# Patient Record
Sex: Male | Born: 1990 | Hispanic: Yes | Marital: Married | Smoking: Current some day smoker
Health system: Southern US, Community
[De-identification: ages and names within clinical notes are randomized; demographics above are authoritative.]

---

## 2015-01-24 ENCOUNTER — Ambulatory Visit: Payer: Worker's Compensation

## 2015-01-24 ENCOUNTER — Ambulatory Visit (INDEPENDENT_AMBULATORY_CARE_PROVIDER_SITE_OTHER): Payer: Worker's Compensation | Admitting: Urgent Care

## 2015-01-24 VITALS — BP 116/80 | HR 67 | Temp 98.3°F | Resp 20 | Ht 67.5 in | Wt 199.4 lb

## 2015-01-24 DIAGNOSIS — S6992XA Unspecified injury of left wrist, hand and finger(s), initial encounter: Secondary | ICD-10-CM

## 2015-01-24 DIAGNOSIS — S6990XA Unspecified injury of unspecified wrist, hand and finger(s), initial encounter: Secondary | ICD-10-CM | POA: Insufficient documentation

## 2015-01-24 DIAGNOSIS — S61012A Laceration without foreign body of left thumb without damage to nail, initial encounter: Secondary | ICD-10-CM

## 2015-01-24 DIAGNOSIS — S61019A Laceration without foreign body of unspecified thumb without damage to nail, initial encounter: Secondary | ICD-10-CM | POA: Insufficient documentation

## 2015-01-24 NOTE — Patient Instructions (Signed)
Cuidados de una laceración - Adultos  °(Laceration Care, Adult) ° Una herida cortante es un corte o lesión que atraviesa todas las capas de la piel y el tejido que se encuentra debajo de la piel.  °TRATAMIENTO  °Algunas laceraciones no requieren sutura. Algunas no deben cerrarse debido a que puede aumentar el riesgo de infección. Es importante que consulte al médico lo antes posible después de recibir una lesión para minimizar el riesgo de infección y aumentar la posibilidad de que se cierre con éxito.  °Cuando se cierra adecuadamente, podrán indicarle analgésicos, si los necesita. La herida debe limpiarse para combatir la infección. El médico usará puntos (suturas), grapas,adhesivo, o tiras adhesivas para reparar la laceración. Estos elementos mantendrán unidos los bordes de la piel para que se cure más rápidamente y para un mejor resultado cosmético. Sin embargo, todas las heridas se curarán con una cicatriz. Una vez que la herida se haya curado, las cicatrices pueden minimizarse cubriendo la herida con pantalla solar durante el día por un lapso se 1 año.  °INSTRUCCIONES PARA EL CUIDADO EN EL HOGAR  °Si tiene puntos o grapas:  °· Mantenga la herida limpia y seca. °· Si tiene un (vendaje) cámbielo al menos una vez al día. Cámbielo si se moja o se ensucia, o según las indicaciones del médico. °· Lave el corte dos veces por día con agua y jabón. Enjuáguelo con agua para quitar todo el jabón. Seque dando palmaditas con una toalla limpia y seca. °· Después de limpiar, aplique una delgada capa de una crema con antibiótico según las indicaciones del médico. Esto le ayudará a prevenir las infecciones y a evitar que el vendaje se adhiera. °· Puede ducharse después de las primeras 24 horas. No remoje la herida en agua hasta que le hayan quitado los puntos. °· Solo tome medicamentos que se pueden comprar sin receta o recetados para el dolor, malestar o fiebre, como le indica el médico. °· Concurra para que le retiren los  puntos o las grapas cuando el médico le indique. °En caso que tenga tiras adhesivas:  °· Mantenga la herida limpia y seca. °· No deje que las tiras se mojen. Puede darse un baño cuidando de mantener la herida seca. °· Si se moja, séquela dando palmaditas con una toalla limpia. °· Las tiras caerán por sí mismas. Puede recortar las tiras a medida que la herida se cura. No quite las tiras que están pegadas a la herida. Ellas se caerán cuando sea el momento. °En caso que le hayan aplicado adhesivo.  °· Podrá mojara momentáneamente la herida en la ducha o el baño. No frote ni sumerja la herida. No practique natación. Evite transpirar con abundancia hasta que el adhesivo se haya caído. Después de ducharse o darse un baño, seque el corte dando palmaditas con una toalla limpia. °· No aplique medicamentos líquidos, en crema o ungüentos mientras el adhesivo esté en su lugar. Podrá aflojarlo antes de que la herida se cure. °· Si tiene un vendaje, tenga cuidado de no aplicar cinta adhesiva directamente sobre el adhesivo. Esto puede hacer que el adhesivo se caiga antes de que la herida se haya curado. °· Evite la exposición prolongada a la luz del sol o a la lámpara solar mientras en adhesivo se encuentre en el lugar. La exposición a los rayos ultravioletas durante el primer año oscurecerá la cicatriz. °· El adhesivo permanecerá sobre la piel durante 5 a 10 días y luego caerá naturalmente. No quite la película de adhesivo. °Deberá aplicarse   la vacuna contra el tétanos si: °· No recuerda cuándo se colocó la vacuna la última vez. °· Nunca recibió esta vacuna. °Si le han aplicado la vacuna contra el tétanos, el brazo podrá hincharse, enrojecer y sentirse caliente al tacto. Esto es frecuente y no es un problema. Si usted necesita aplicarse la vacuna y se niega a recibirla, corre riesgo de contraer tétanos. Ésta es una enfermedad grave.  °SOLICITE ATENCIÓN MÉDICA SI:  °· Presenta enrojecimiento, hinchazón o aumento del dolor en la  herida. °· Hay rayas rojas que salen de la herida. °· Observa un líquido blanco amarillento (pus) en la herida. °· Tiene fiebre. °· Advierte un olor fétido que proviene de la herida o del vendaje. °· La herida se abre luego de que le han extraído las suturas. °· Nota que en la herida hay algún cuerpo extraño como un trozo de madera o vidrio. °· La herida está en su mano o pie y observa que no puede mover correctamente los dedos. °SOLICITE ATENCIÓN MÉDICA DE INMEDIATO SI:  °· El dolor no se alivia con los medicamentos. °· Hay una zona muy hinchada alrededor de la herida que le causa dolor y adormecimiento, o advierte un cambio en el color en el brazo, la mano, la pierna o el pie. °· La herida se abre y sangra nuevamente. °· Siente que el adormecimiento, la debilidad o la pérdida de la función de la articulación que rodea la herida empeoran. °· Palpa nódulos dolorosos cerca de la herida o bajo la piel en cualquier zona del cuerpo. °ASEGÚRESE DE QUE:  °· Comprende estas instrucciones. °· Controlará su enfermedad. °· Solicitará ayuda de inmediato si no mejora o si empeora. °Document Released: 07/01/2005 Document Revised: 09/23/2011 °[employer]® Patient Information ©2015 [employer]. This information is not intended to replace advice given to you by your health care provider. Make sure you discuss any questions you have with your health care provider. ° °

## 2015-01-24 NOTE — Progress Notes (Signed)
    MRN: [id number] DOB: 12-06-1990  Subjective:   Darius FischerRoberto Jose Doyle Heart Doyle is a 24 y.o. male presenting for chief complaint of Laceration  Reports onset of left hand injury today while at work. Patient was handle a heavy roll of paper used in carpeting, states that it slipped and fell directly onto his left hand and making the most impact with his left thumb. Patient felt pain immediately and started bleeding profusely. He was attended to by his fellow co-workers, they applied a pressure dressing to his hand and he acme directly to [facility]. Currently, patients admits 10/10 sharp pain, worse with movement. Denies decreased ROM or sensation, bony deformity, hearing popping or tearing noises. Denies any other aggravating or relieving factors, no other questions or concerns.  Darius Doyle currently has no medications in their medication list. He is allergic to other.  ROS As in subjective.  Objective:   Vitals: BP 116/80 mmHg  Pulse 67  Temp(Src) 98.3 F (36.8 C) (Oral)  Resp 20  Ht 5' 7.5" (1.715 m)  Wt 199 lb 6 oz (90.436 kg)  BMI 30.75 kg/m2  SpO2 98%  Physical Exam  Constitutional: He is oriented to person, place, and time. He appears well-developed and well-nourished.  Cardiovascular: Normal rate.   Pulmonary/Chest: Effort normal.  Musculoskeletal:       Left hand: He exhibits tenderness, laceration and swelling. He exhibits normal range of motion, no bony tenderness, normal capillary refill and no deformity. Normal sensation noted. Normal strength noted. He exhibits no finger abduction, no thumb/finger opposition and no wrist extension trouble.       Hands: Neurological: He is alert and oriented to person, place, and time.  Skin: Skin is warm and dry. No rash noted. No erythema. No pallor.   [facility] reading (PRIMARY) by  Dr. Alwyn RenHopper and PA-Fryda Molenda. Left hand and thumb - no fracture or dislocation.  Assessment and Plan :   1. Hand injury, left, initial  encounter 2. Thumb injury, left, initial encounter 3. Thumb laceration, left, initial encounter - Stable, wound care instructions reviewed, work restrictions provided, follow up in 7-10 days for suture removal.  Darius BambergMario Oluwafemi Villella, PA-C [facility] [phone] 01/24/2015 6:10 PM

## 2017-08-20 ENCOUNTER — Emergency Department (HOSPITAL_COMMUNITY)
Admission: EM | Admit: 2017-08-20 | Discharge: 2017-08-20 | Disposition: A | Discharge status: Home / Self Care | Payer: Self-pay | Attending: Emergency Medicine | Admitting: Emergency Medicine

## 2017-08-20 ENCOUNTER — Emergency Department (HOSPITAL_COMMUNITY): Payer: Self-pay

## 2017-08-20 ENCOUNTER — Encounter (HOSPITAL_COMMUNITY): Payer: Self-pay

## 2017-08-20 DIAGNOSIS — M7918 Myalgia, other site: Secondary | ICD-10-CM | POA: Insufficient documentation

## 2017-08-20 DIAGNOSIS — R079 Chest pain, unspecified: Secondary | ICD-10-CM | POA: Insufficient documentation

## 2017-08-20 DIAGNOSIS — R509 Fever, unspecified: Secondary | ICD-10-CM | POA: Insufficient documentation

## 2017-08-20 DIAGNOSIS — R05 Cough: Secondary | ICD-10-CM | POA: Insufficient documentation

## 2017-08-20 LAB — CBC WITH DIFFERENTIAL/PLATELET
BASOS ABS: 0 10*3/uL (ref 0.0–0.1)
Basophils Relative: 0 %
EOS PCT: 0 %
Eosinophils Absolute: 0 10*3/uL (ref 0.0–0.7)
HCT: 48.4 % (ref 39.0–52.0)
Hemoglobin: 17.1 g/dL — ABNORMAL HIGH (ref 13.0–17.0)
Lymphocytes Relative: 13 %
Lymphs Abs: 0.7 10*3/uL (ref 0.7–4.0)
MCH: 33.7 pg (ref 26.0–34.0)
MCHC: 35.3 g/dL (ref 30.0–36.0)
MCV: 95.3 fL (ref 78.0–100.0)
MONO ABS: 0.4 10*3/uL (ref 0.1–1.0)
MONOS PCT: 7 %
Neutro Abs: 4.4 10*3/uL (ref 1.7–7.7)
Neutrophils Relative %: 80 %
PLATELETS: 97 10*3/uL — AB (ref 150–400)
RBC: 5.08 MIL/uL (ref 4.22–5.81)
RDW: 12.4 % (ref 11.5–15.5)
WBC: 5.5 10*3/uL (ref 4.0–10.5)

## 2017-08-20 LAB — I-STAT TROPONIN, ED: Troponin i, poc: 0 ng/mL (ref 0.00–0.08)

## 2017-08-20 LAB — BASIC METABOLIC PANEL
Anion gap: 13 (ref 5–15)
BUN: 9 mg/dL (ref 6–20)
CO2: 23 mmol/L (ref 22–32)
Calcium: 9.2 mg/dL (ref 8.9–10.3)
Chloride: 98 mmol/L — ABNORMAL LOW (ref 101–111)
Creatinine, Ser: 0.99 mg/dL (ref 0.61–1.24)
GFR calc Af Amer: >60 mL/min (ref >60)
GLUCOSE: 107 mg/dL — AB (ref 65–99)
Potassium: 4 mmol/L (ref 3.5–5.1)
Sodium: 134 mmol/L — ABNORMAL LOW (ref 135–145)

## 2017-08-20 LAB — I-STAT CG4 LACTIC ACID, ED: Lactic Acid, Venous: 1.1 mmol/L (ref 0.5–1.9)

## 2017-08-20 MED ORDER — BENZONATATE 100 MG PO CAPS
100.0000 mg | ORAL_CAPSULE | Freq: Once | ORAL | Status: AC
  Administered 2017-08-20: 100 mg via ORAL
  Filled 2017-08-20: qty 1

## 2017-08-20 MED ORDER — ACETAMINOPHEN 325 MG PO TABS
650.0000 mg | ORAL_TABLET | Freq: Once | ORAL | Status: AC | PRN
  Administered 2017-08-20: 650 mg via ORAL
  Filled 2017-08-20: qty 2

## 2017-08-20 NOTE — ED Notes (Signed)
Pts name called for a room no answer 

## 2017-08-20 NOTE — ED Notes (Addendum)
No answer in waiting room 

## 2017-08-20 NOTE — ED Notes (Signed)
No answer in waiting room 

## 2017-08-20 NOTE — ED Triage Notes (Signed)
Patient complains of cough and fever with body aches x 2 days, has been taking otc meds

## 2017-08-20 NOTE — ED Provider Notes (Signed)
MSE was initiated and I personally evaluated the patient and placed orders (if any) at  3:15 PM on August 20, 2017.  Patient placed in Quick Look pathway, seen and evaluated   Chief Complaint: Cough, fever, generalized body aches, chest pain  HPI:   Patient reports 2-day history of productive cough with mucus, subjective fever, mid chest pain, generalized body aches.  He took Tylenol with improvement in his fever at home.  Sick contacts including roommate with similar symptoms.  Did not receive his influenza vaccine this year.  Denies any prior cardiac or pulmonary problems.  Denies any recent surgeries, recent prolonged travel, hemoptysis or hormonal use.  ROS: Fever  Physical Exam:   Gen: No distress  Neuro: Awake and Alert  Skin: Warm    Focused Exam: Lungs clear to auscultation bilaterally.  Patient does appear slightly diaphoretic and uncomfortable.  He is tachycardic but not tachypneic.  No abdominal or chest tenderness to palpation.  No improvement in fever with antipyretics given here in the ED.   Initiation of care has begun. The patient has been counseled on the process, plan, and necessity for staying for the completion/evaluation, and the remainder of the medical screening examination    Dietrich PatesKhatri, Nevae Pinnix, PA-C 08/20/17 1517    Cathren LaineSteinl, Kevin, MD 08/20/17 [phone]

## 2017-08-20 NOTE — ED Notes (Addendum)
Pt reports SOB and holding mid chest, pt diaphoretic in triage

## 2018-10-10 IMAGING — DX DG CHEST 2V
2 series · 2 of 2 positions shown · non-contrast
Comparison: None.

CLINICAL DATA: Fever

EXAM:
CHEST  2 VIEW

[chest pa]
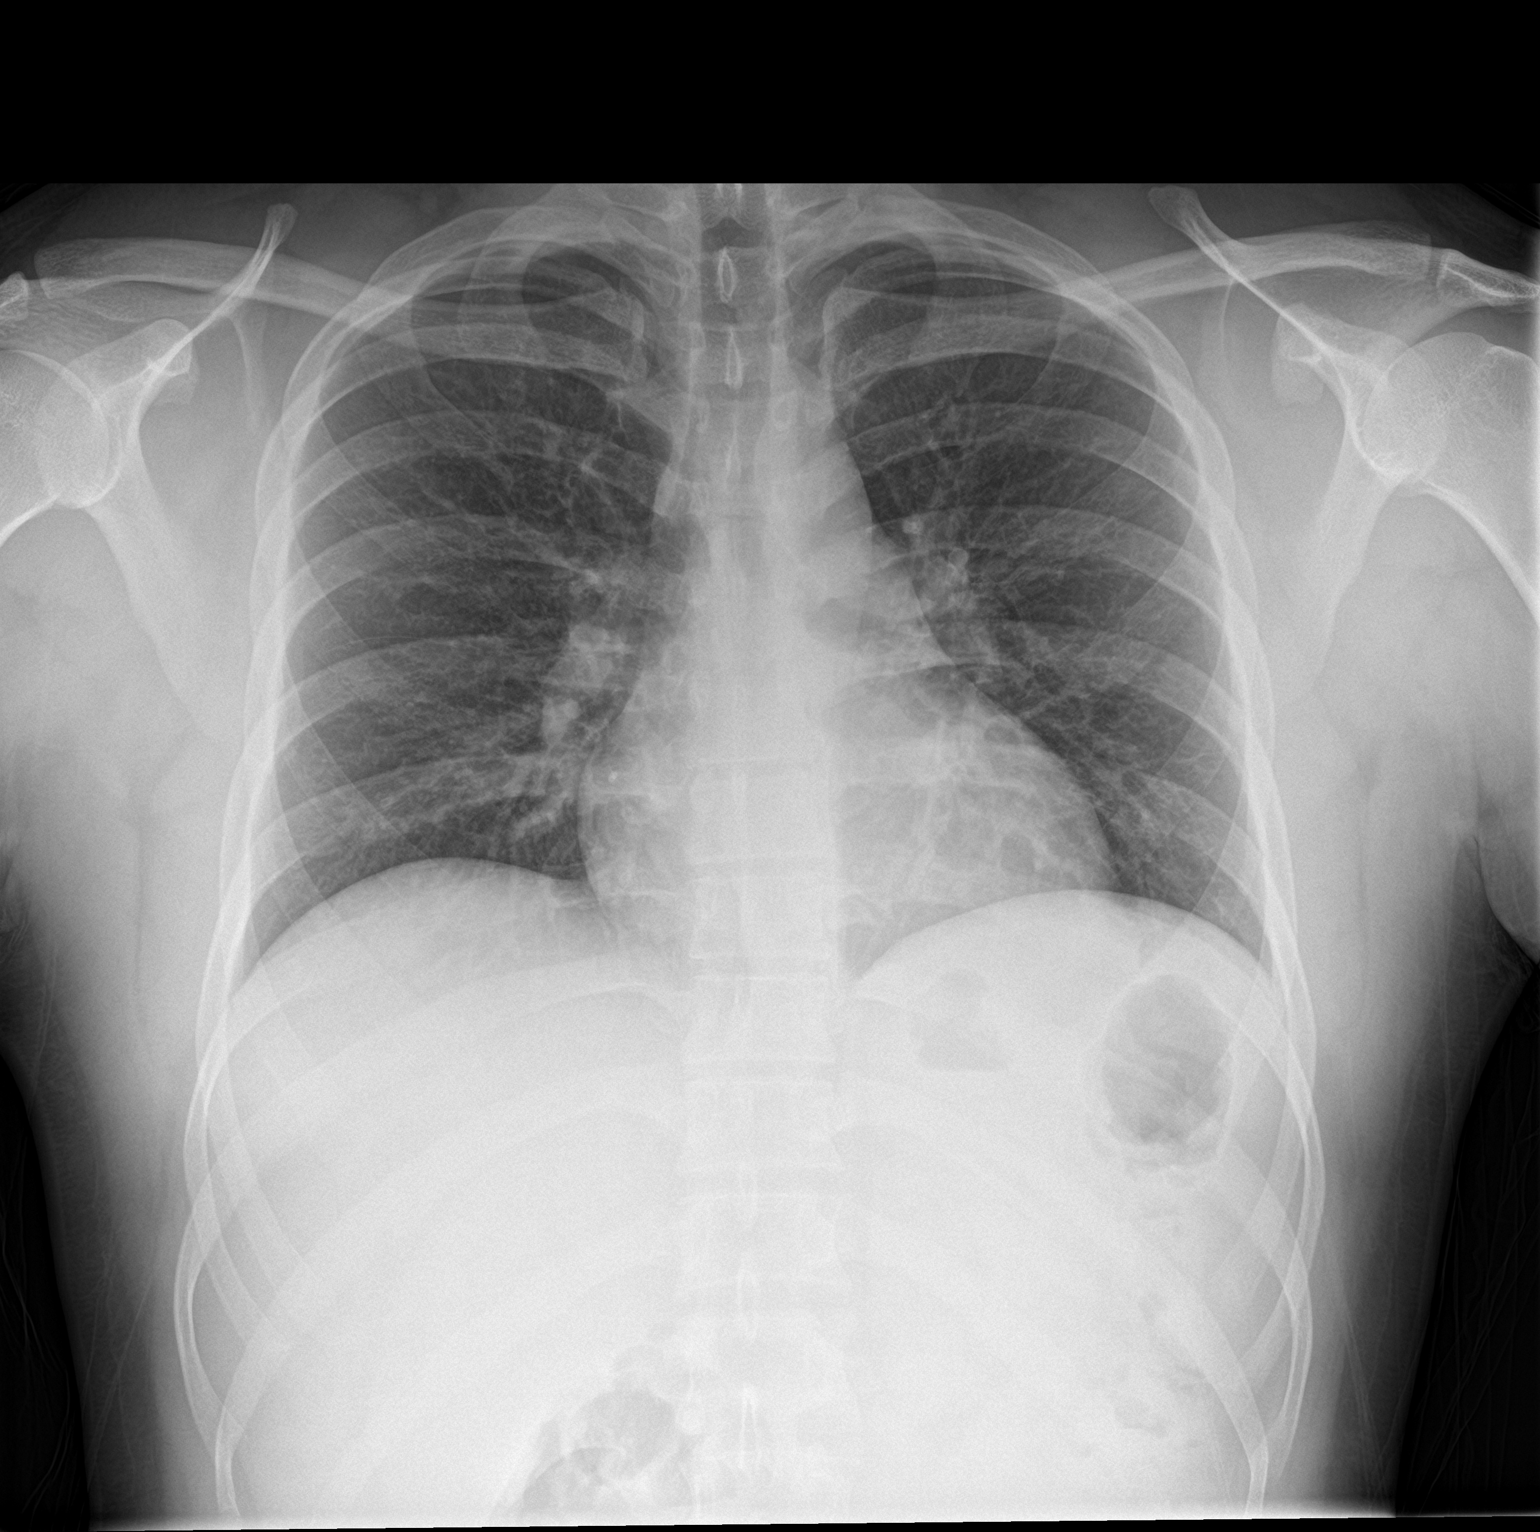

[chest lat]
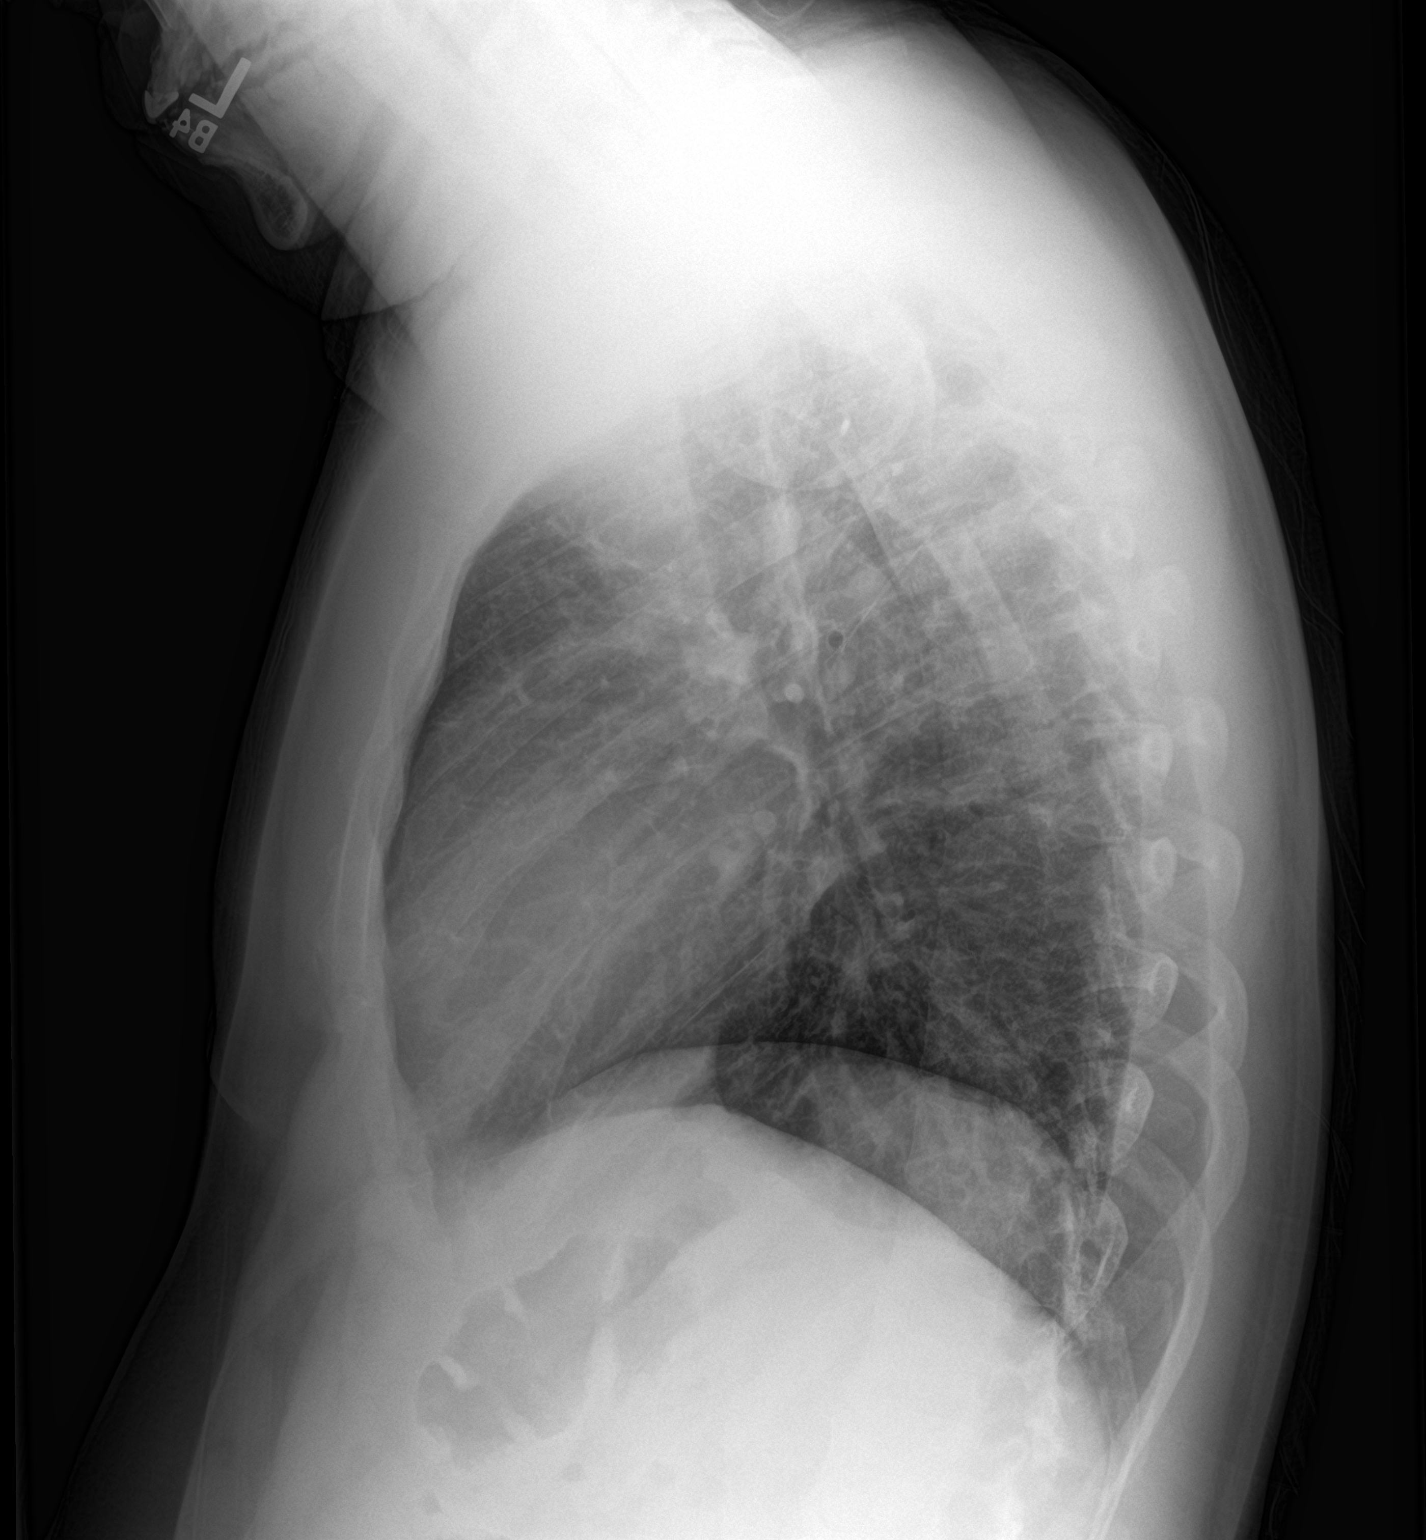

[2 of 2 positions shown; findings below may reference images not displayed]

FINDINGS: The heart size and mediastinal contours are within normal limits.
Both lungs are clear. The visualized skeletal structures are
unremarkable.
IMPRESSION: No active cardiopulmonary disease.
